# Patient Record
Sex: Male | Born: 1985 | Race: Black or African American | Hispanic: No | Marital: Single
Health system: Southern US, Community
[De-identification: ages and names within clinical notes are randomized; demographics above are authoritative.]

---

## 2018-01-13 ENCOUNTER — Emergency Department (HOSPITAL_COMMUNITY): Payer: Self-pay

## 2018-01-13 ENCOUNTER — Other Ambulatory Visit: Payer: Self-pay

## 2018-01-13 ENCOUNTER — Emergency Department (HOSPITAL_COMMUNITY)
Admission: EM | Admit: 2018-01-13 | Discharge: 2018-01-14 | Disposition: A | Discharge status: Home / Self Care | Payer: Self-pay | Attending: Emergency Medicine | Admitting: Emergency Medicine

## 2018-01-13 DIAGNOSIS — Y906 Blood alcohol level of 120-199 mg/100 ml: Secondary | ICD-10-CM | POA: Insufficient documentation

## 2018-01-13 DIAGNOSIS — F10920 Alcohol use, unspecified with intoxication, uncomplicated: Secondary | ICD-10-CM | POA: Insufficient documentation

## 2018-01-13 DIAGNOSIS — F14150 Cocaine abuse with cocaine-induced psychotic disorder with delusions: Secondary | ICD-10-CM | POA: Diagnosis present

## 2018-01-13 LAB — SALICYLATE LEVEL: Salicylate Lvl: <7 mg/dL (ref 2.8–30.0)

## 2018-01-13 LAB — CBC WITH DIFFERENTIAL/PLATELET
BASOS ABS: 0.1 10*3/uL (ref 0.0–0.1)
Basophils Relative: 1 %
Eosinophils Absolute: 0.2 10*3/uL (ref 0.0–0.7)
Eosinophils Relative: 2 %
HEMATOCRIT: 45.6 % (ref 39.0–52.0)
Hemoglobin: 16.5 g/dL (ref 13.0–17.0)
LYMPHS ABS: 4.2 10*3/uL — AB (ref 0.7–4.0)
LYMPHS PCT: 41 %
MCH: 32.5 pg (ref 26.0–34.0)
MCHC: 36.2 g/dL — AB (ref 30.0–36.0)
MCV: 89.9 fL (ref 78.0–100.0)
MONO ABS: 0.4 10*3/uL (ref 0.1–1.0)
MONOS PCT: 4 %
NEUTROS ABS: 5.4 10*3/uL (ref 1.7–7.7)
Neutrophils Relative %: 52 %
Platelets: 232 10*3/uL (ref 150–400)
RBC: 5.07 MIL/uL (ref 4.22–5.81)
RDW: 13.2 % (ref 11.5–15.5)
WBC: 10.1 10*3/uL (ref 4.0–10.5)

## 2018-01-13 LAB — COMPREHENSIVE METABOLIC PANEL
ALT: 25 U/L (ref 0–44)
AST: 28 U/L (ref 15–41)
Albumin: 4.8 g/dL (ref 3.5–5.0)
Alkaline Phosphatase: 45 U/L (ref 38–126)
Anion gap: 14 (ref 5–15)
BUN: 8 mg/dL (ref 6–20)
CO2: 20 mmol/L — ABNORMAL LOW (ref 22–32)
Calcium: 9.6 mg/dL (ref 8.9–10.3)
Chloride: 107 mmol/L (ref 98–111)
Creatinine, Ser: 0.86 mg/dL (ref 0.61–1.24)
GFR calc Af Amer: >60 mL/min (ref >60)
Glucose, Bld: 103 mg/dL — ABNORMAL HIGH (ref 70–99)
POTASSIUM: 3.6 mmol/L (ref 3.5–5.1)
Sodium: 141 mmol/L (ref 135–145)
Total Bilirubin: 0.4 mg/dL (ref 0.3–1.2)
Total Protein: 8.7 g/dL — ABNORMAL HIGH (ref 6.5–8.1)

## 2018-01-13 LAB — CK: CK TOTAL: 276 U/L (ref 49–397)

## 2018-01-13 LAB — RAPID URINE DRUG SCREEN, HOSP PERFORMED
AMPHETAMINES: NOT DETECTED
Barbiturates: NOT DETECTED
Benzodiazepines: NOT DETECTED
Cocaine: POSITIVE — AB
Opiates: NOT DETECTED
TETRAHYDROCANNABINOL: NOT DETECTED

## 2018-01-13 LAB — ETHANOL: Alcohol, Ethyl (B): 179 mg/dL — ABNORMAL HIGH (ref <10)

## 2018-01-13 LAB — ACETAMINOPHEN LEVEL: Acetaminophen (Tylenol), Serum: <10 ug/mL — ABNORMAL LOW (ref 10–30)

## 2018-01-13 MED ORDER — DIPHENHYDRAMINE HCL 50 MG/ML IJ SOLN: 50.0000 mg | Freq: Once | INTRAMUSCULAR | Status: DC | PRN

## 2018-01-13 MED ORDER — LORAZEPAM 2 MG/ML IJ SOLN: 2.0000 mg | Freq: Once | INTRAMUSCULAR | Status: DC | PRN

## 2018-01-13 MED ORDER — STERILE WATER FOR INJECTION IJ SOLN
INTRAMUSCULAR | Status: AC
  Administered 2018-01-13: 2.4 mL
  Filled 2018-01-13: qty 10

## 2018-01-13 MED ORDER — ZIPRASIDONE MESYLATE 20 MG IM SOLR: 20.0000 mg | Freq: Once | INTRAMUSCULAR | Status: DC | PRN

## 2018-01-13 MED ORDER — LORAZEPAM 2 MG/ML IJ SOLN: 2.0000 mg | Freq: Once | INTRAMUSCULAR | Status: DC

## 2018-01-13 MED ORDER — ZIPRASIDONE MESYLATE 20 MG IM SOLR
20.0000 mg | Freq: Once | INTRAMUSCULAR | Status: AC
  Administered 2018-01-13: 20 mg via INTRAMUSCULAR
  Filled 2018-01-13: qty 20

## 2018-01-13 NOTE — ED Provider Notes (Signed)
[facility]-EMERGENCY DEPT Provider Note   CSN: [id number] Arrival date & time: 01/13/18  [id number]     History   Chief Complaint Chief Complaint  Patient presents with  . acute psychosis    HPI Burnett CorrenteJamil Geralynn Rilehmad Bryant-Perry is a 32 y.o. male.  32 year old male presents via police with possible acute psychosis.  Patient seen by prior provider and was medicated due to agitation therefore history is per nursing notes as well as his note.  Patient was noted to be loud and belligerent and would not give his name.  Stated they were aliens in his blood.  Because he was agitated and combative he was given Geodon.  No further history obtainable due to his current state.     No past medical history on file.  There are no active problems to display for this patient.         Home Medications    Prior to Admission medications   Not on File    Family History No family history on file.  Social History Social History   Tobacco Use  . Smoking status: Not on file  Substance Use Topics  . Alcohol use: Not on file  . Drug use: Not on file     Allergies   Patient has no allergy information on record.   Review of Systems Review of Systems  Unable to perform ROS: Mental status change     Physical Exam Updated Vital Signs There were no vitals taken for this visit.  Physical Exam  Constitutional: He appears well-developed and well-nourished. He appears lethargic.  Non-toxic appearance. No distress.  HENT:  Head: Normocephalic and atraumatic.  Eyes: Pupils are equal, round, and reactive to light. Conjunctivae, EOM and lids are normal.  Neck: Normal range of motion. Neck supple. No tracheal deviation present. No thyroid mass present.  Cardiovascular: Normal rate, regular rhythm and normal heart sounds. Exam reveals no gallop.  No murmur heard. Pulmonary/Chest: Effort normal and breath sounds normal. No stridor. No respiratory distress. He has no decreased  breath sounds. He has no wheezes. He has no rhonchi. He has no rales.  Abdominal: Soft. Normal appearance and bowel sounds are normal. He exhibits no distension. There is no tenderness. There is no rebound and no CVA tenderness.  Musculoskeletal: Normal range of motion. He exhibits no edema or tenderness.  Neurological: He appears lethargic. No cranial nerve deficit or sensory deficit. GCS eye subscore is 2. GCS verbal subscore is 3. GCS motor subscore is 5.  Skin: Skin is warm and dry. No abrasion and no rash noted.  Psychiatric: He is inattentive.  Nursing note and vitals reviewed.    ED Treatments / Results  Labs (all labs ordered are listed, but only abnormal results are displayed) Labs Reviewed  CBC WITH DIFFERENTIAL/PLATELET - Abnormal; Notable for the following components:      Result Value   MCHC 36.2 (*)    Lymphs Abs 4.2 (*)    All other components within normal limits  COMPREHENSIVE METABOLIC PANEL  ETHANOL  ACETAMINOPHEN LEVEL  SALICYLATE LEVEL  CK  RAPID URINE DRUG SCREEN, HOSP PERFORMED    EKG EKG Interpretation  Date/Time:  Friday January 13 2018 06:49:54 EDT Ventricular Rate:  117 PR Interval:    QRS Duration: 82 QT Interval:  354 QTC Calculation: 494 R Axis:   83 Text Interpretation:  Sinus tachycardia Ventricular trigeminy LAE, consider biatrial enlargement Probable anteroseptal infarct, old Minimal ST depression, inferior leads Prolonged QT interval No  previous ECGs available Confirmed by Glynn Octave [id number]) on 01/13/2018 6:54:13 AM   Radiology No results found.  Procedures Procedures (including critical care time)  Medications Ordered in ED Medications  LORazepam (ATIVAN) injection 2 mg (has no administration in time range)  ziprasidone (GEODON) injection 20 mg (20 mg Intramuscular Given 01/13/18 0641)  sterile water (preservative free) injection (2.4 mLs  Given 01/13/18 6045)     Initial Impression / Assessment and Plan / ED Course  I have  reviewed the triage vital signs and the nursing notes.  Pertinent labs & imaging results that were available during my care of the patient were reviewed by me and considered in my medical decision making (see chart for details).     Patient was monitored here and became more alert after his alcohol and Geodon were off.  Had been placed under IVC by prior provider.  He is now medically cleared for psychiatric disposition  Final Clinical Impressions(s) / ED Diagnoses   Final diagnoses:  None    ED Discharge Orders    None       Lorre Nick, MD 01/13/18 1240

## 2018-01-13 NOTE — ED Notes (Signed)
Security and charge RN has been informed that restraints have been removed and pt is chemically restrained and compliant.

## 2018-01-13 NOTE — ED Notes (Signed)
Bed: WA15 Expected date:  Expected time:  Means of arrival:  Comments: 

## 2018-01-13 NOTE — ED Notes (Signed)
SBAR Report received from previous nurse. Pt received asleep on unit.and unable to participate in assessment of current SI/ HI, A/V H, depression, anxiety, or pain at this time, and appears otherwise stable and free of distress and resting. Pt reminded of camera surveillance, q 15 min rounds, and rules of the milieu. Will continue to assess.
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
## 2018-01-13 NOTE — ED Notes (Signed)
Patient alert and oriented, calm and cooperative.  Patient asking for a meal tray and one was provided for him.  Patient did ask, "how long do I have to stay here?"

## 2018-01-13 NOTE — ED Notes (Signed)
Patient moved from room 15 to 41 in the Acute Care Unit.  1 bag of belongings locked in locker 41.

## 2018-01-13 NOTE — ED Triage Notes (Signed)
Pt here with [employer] stating "aliens are in his blood" and "we cant take his blood" Pt yelling in triage and is not redirectable. Pt thinks it is 2033 and is unable to state his name or where he is

## 2018-01-13 NOTE — BH Assessment (Signed)
[facility] Assessment Progress Note  Case was staffed with Parks FNP who recommended patient be observed and monitored for safety. Patient will be seen by psychiatry in the a.m.     

## 2018-01-13 NOTE — ED Provider Notes (Signed)
MSE was initiated and I personally evaluated the patient and placed orders (if any) at  6:55 AM on January 13, 2018.  Patient brought in by police and appears to be acutely psychotic.  He is loud and belligerent and not redirectable.  He refuses to give his name.  He states there are "aliens in my blood".  He states the year is 2033. He will not state where he is or who he is.  He appears to be acutely psychotic and unable to make decisions for himself.  IVC paperwork is completed.  Patient chemically sedated for safety of patient and staff.  CRITICAL CARE Performed by: Glynn OctaveANCOUR, Martina Brodbeck Total critical care time: 31 minutes Critical care time was exclusive of separately billable procedures and treating other patients. Critical care was necessary to treat or prevent imminent or life-threatening deterioration. Critical care was time spent personally by me on the following activities: development of treatment plan with patient and/or surrogate as well as nursing, discussions with consultants, evaluation of patient's response to treatment, examination of patient, obtaining history from patient or surrogate, ordering and performing treatments and interventions, ordering and review of laboratory studies, ordering and review of radiographic studies, pulse oximetry and re-evaluation of patient's condition.  The patient appears stable so that the remainder of the MSE may be completed by another provider.   Glynn Octaveancour, Demetrius Barrell, MD 01/13/18 [phone]

## 2018-01-13 NOTE — ED Notes (Signed)
[facility] Expected date:  Expected time:  Means of arrival:  Comments: RM 15

## 2018-01-13 NOTE — ED Notes (Addendum)
Patient reports he is not suicidal or homicidal at this time.  When asked if he has any auditory or visual hallucinations, patient hesitated and said he did.  Patient also admitted to taking medications for these symptoms in the past but was not currently taking these meds.  Patient reports he felt like someone had slipped him some drugs.

## 2018-01-14 DIAGNOSIS — F14150 Cocaine abuse with cocaine-induced psychotic disorder with delusions: Secondary | ICD-10-CM | POA: Diagnosis present

## 2018-01-14 NOTE — Consult Note (Addendum)
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  He was given a PRN medication, Geodon, and slept.  Now, he denies suicidal/homicidal ideations, hallucinations, delusions, and withdrawal symptoms.  Encouraged him to meet with Peer Support but he refused.  Stable for discharge.  Total Time spent with patient: 45 minutes  Past Psychiatric History: substance abuse  Past Medical History: No past medical history on file.  Family History: No family history on file. Family Psychiatric  History: none Social History:  Social History   Substance and Sexual Activity  Alcohol Use Not on file    Social History   Substance and Sexual Activity  Drug Use Not on file   Social History   Socioeconomic History  . Marital status: Single    Spouse name: Not on file  . Number of children: Not on file  . Years of education: Not on file  . Highest education level: Not on file  Occupational History  . Not on file  Social Needs  . Financial resource strain: Not on file  . Food insecurity:    Worry: Not on file    Inability: Not on file  . Transportation needs:    Medical: Not on file    Non-medical: Not on file  Tobacco Use  . Smoking status: Not on file  Substance and Sexual Activity  . Alcohol use: Not on file  . Drug use: Not on file  . Sexual activity: Not on file  Lifestyle  . Physical activity:    Days per week: Not on file    Minutes per session: Not on file  . Stress: Not on file  Relationships  . Social connections:    Talks on phone:  Not on file    Gets together: Not on file    Attends religious service: Not on file    Active member of club or organization: Not on file    Attends meetings of clubs or organizations: Not on file    Relationship status: Not on file  Other Topics Concern  . Not on file  Social History Narrative  . Not on file    Has this patient used any form of tobacco in the last 30 days? (Cigarettes, Smokeless Tobacco, Cigars, and/or Pipes) A prescription for an FDA-approved tobacco cessation medication was offered at discharge and the patient refused  Current Medications: Current Facility-Administered Medications  Medication Dose Route Frequency Provider Last Rate Last Dose  . diphenhydrAMINE (BENADRYL) injection 50 mg  50 mg Intramuscular Once PRN Laveda Abbe, NP      . LORazepam (ATIVAN) injection 2 mg  2 mg Intramuscular Once Glynn Octave, MD   Stopped at 01/13/18 [phone]  . ziprasidone (GEODON) injection 20 mg  20 mg Intramuscular Once PRN Laveda Abbe, NP       Current Outpatient Medications  Medication Sig Dispense Refill  . OVER THE COUNTER MEDICATION      PTA Medications:  (Not in a hospital admission)  Musculoskeletal: Strength & Muscle Tone: within normal limits Gait & Station: normal Patient leans: N/A  Psychiatric Specialty Exam: Physical Exam  Nursing note and vitals reviewed. Constitutional: He is oriented to person, place, and time. He appears well-developed and well-nourished.  HENT:  Head: Normocephalic.  Neck: Normal range of motion.  Respiratory: Effort normal.  Musculoskeletal: Normal range of motion.  Neurological: He is alert and oriented to person, place, and time.  Psychiatric: His speech is normal and behavior is normal. Judgment and thought content normal. His mood appears anxious. His affect is blunt. Cognition and memory are normal.    Review of Systems  Psychiatric/Behavioral: Positive for substance abuse. The patient is  nervous/anxious.   All other systems reviewed and are negative.   Blood pressure (!) 125/94, pulse 63, temperature 98 F (36.7 C), temperature source Oral, resp. rate 18, SpO2 100 %.There is no height or weight on file to calculate BMI.  General Appearance: Casual  Eye Contact:  Good  Speech:  Normal Rate  Volume:  Normal  Mood:  Anxious  Affect:  Blunt  Thought Process:  Coherent and Descriptions of Associations: Intact  Orientation:  Full (Time, Place, and Person)  Thought Content:  WDL and Logical  Suicidal Thoughts:  No  Homicidal Thoughts:  No  Memory:  Immediate;   Good Recent;   Good Remote;   Good  Judgement:  Fair  Insight:  Fair  Psychomotor Activity:  Normal  Concentration:  Concentration: Good and Attention Span: Good  Recall:  Good  Fund of Knowledge:  Fair  Language:  Good  Akathisia:  No  Handed:  Right  AIMS (if indicated):     Assets:  Leisure Time Physical Health Resilience Social Support  ADL's:  Intact  Cognition:  WNL  Sleep:        Demographic Factors:  Male  Loss Factors: NA  Historical Factors: NA  Risk Reduction Factors:   Sense of responsibility to family, Living with another person, especially a relative and Positive social support  Continued Clinical Symptoms:  Anxiety, mild  Cognitive Features That Contribute To Risk:  None    Suicide Risk:  Minimal: No identifiable suicidal ideation.  Patients presenting with no risk factors but with morbid ruminations; may be classified as minimal risk based on the severity of the depressive symptoms    Plan Of Care/Follow-up recommendations:  Activity:  as tolerated Diet:  heart healthy diet  Disposition: discharge home Nanine MeansLORD, JAMISON, NP 01/14/2018, 9:36 AM   Agree with NP assessment

## 2018-01-14 NOTE — ED Notes (Signed)
Pt has called for his ride, which he says will be here at approx 1500. He continues to be calm/cooperative.

## 2018-01-14 NOTE — ED Notes (Signed)
Report received from previous shift. Randall Wade has remained asleep since shift began, leaving breakfast tray untouched. He remains safe with checks, rounds, and camera monitoring in place.

## 2019-09-11 IMAGING — CT CT HEAD W/O CM
1 series · 16 of 30 positions shown, 20 images · non-contrast
Comparison: None.

CLINICAL DATA: 31-year-old male with a history of psychosis

EXAM:
CT HEAD WITHOUT CONTRAST
TECHNIQUE: Contiguous axial images were obtained from the base of the skull
through the vertex without intravenous contrast.

[Series 2: head wo · axial · 0.47mm/px · z∈[-184,-49]mm · 16 of 30 slices shown, 20 images]
[im 2/30  brain]
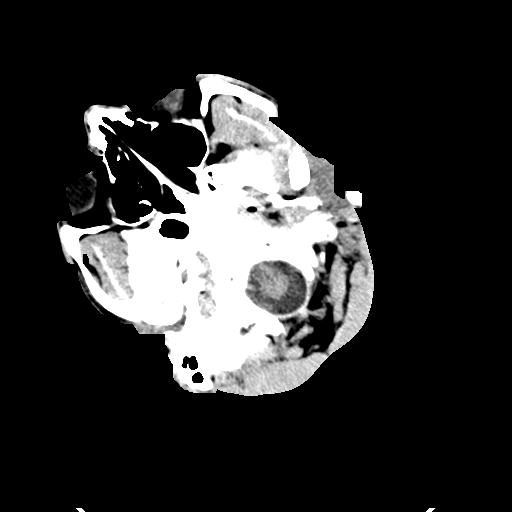
[im 2/30  bone]
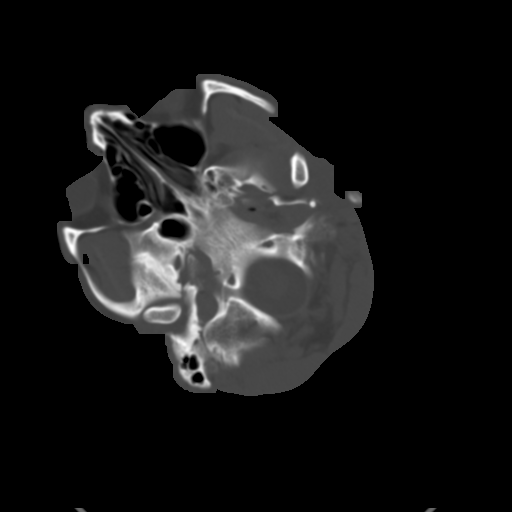
[im 4/30  brain]
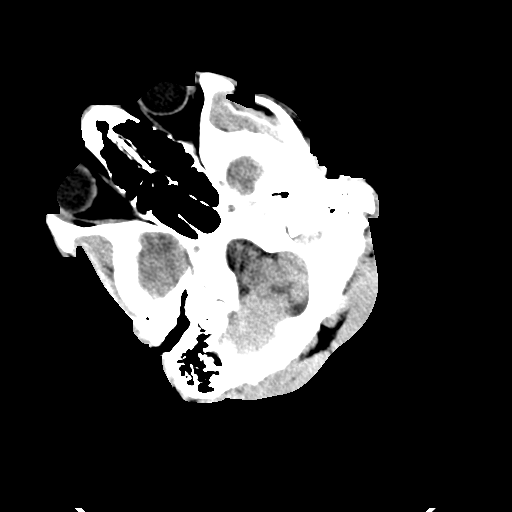
[im 6/30  brain]
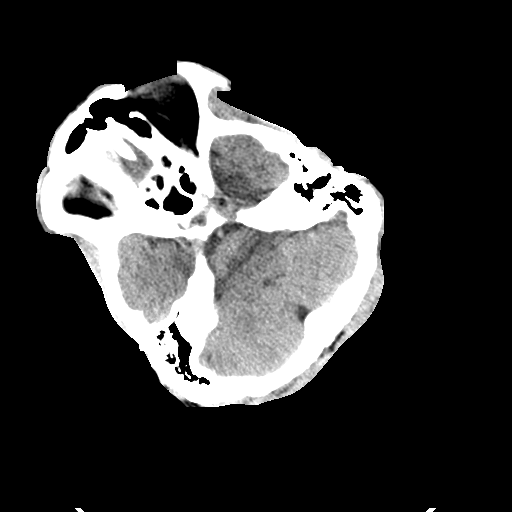
[im 8/30  brain]
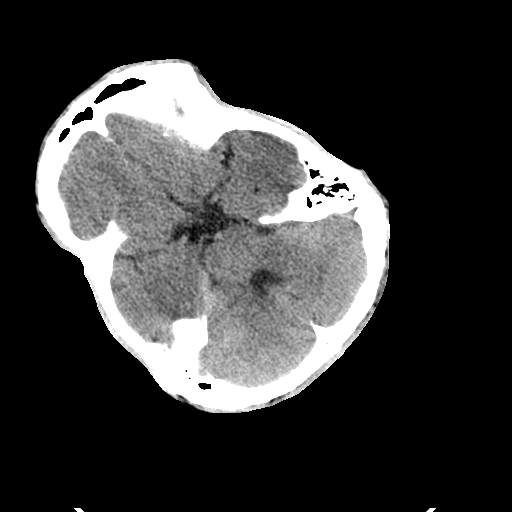
[im 9/30  brain]
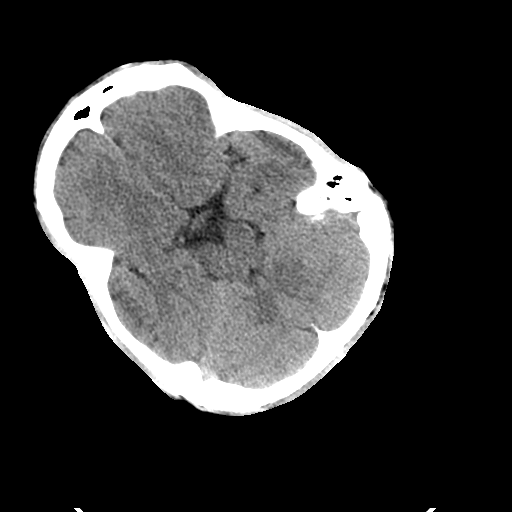
[im 9/30  bone]
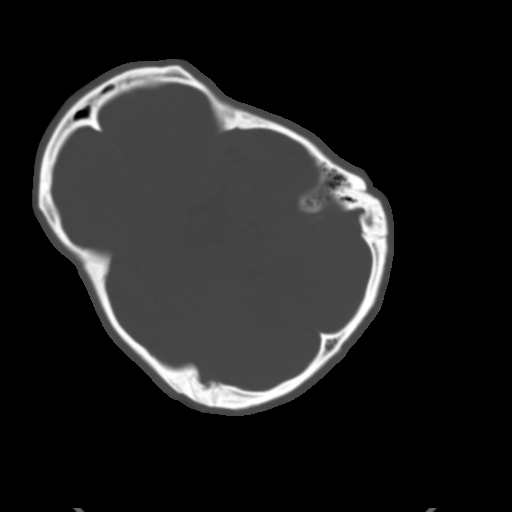
[im 11/30  brain]
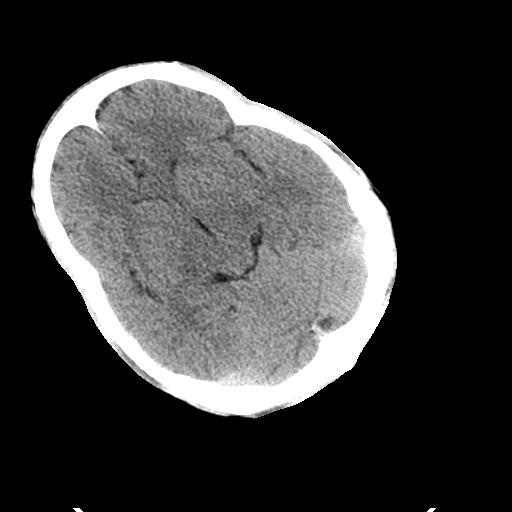
[im 13/30  brain]
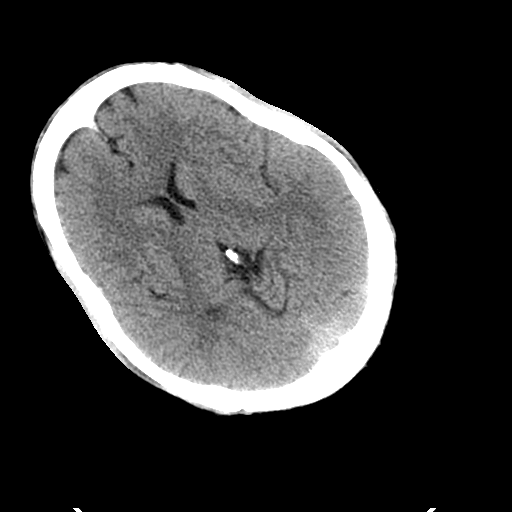
[im 15/30  brain]
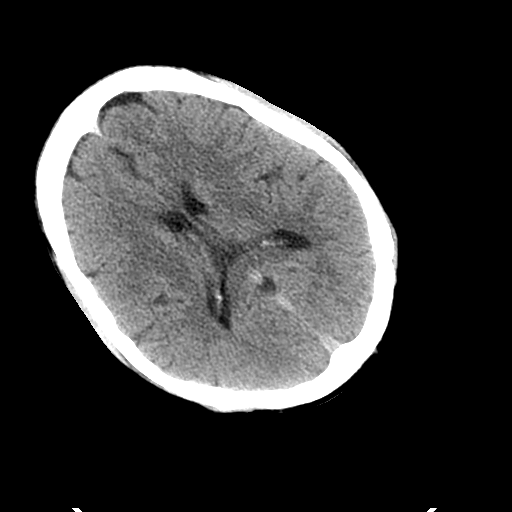
[im 16/30  brain]
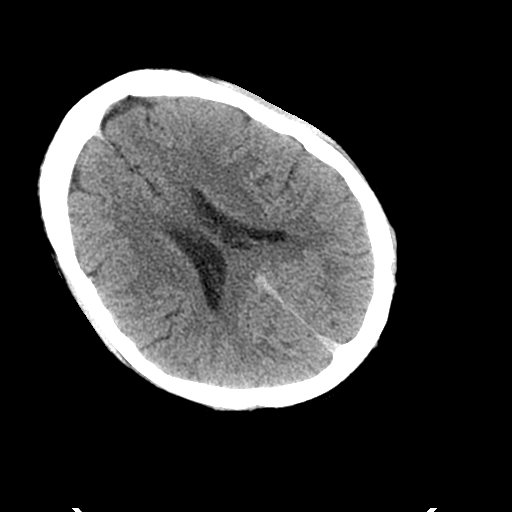
[im 16/30  bone]
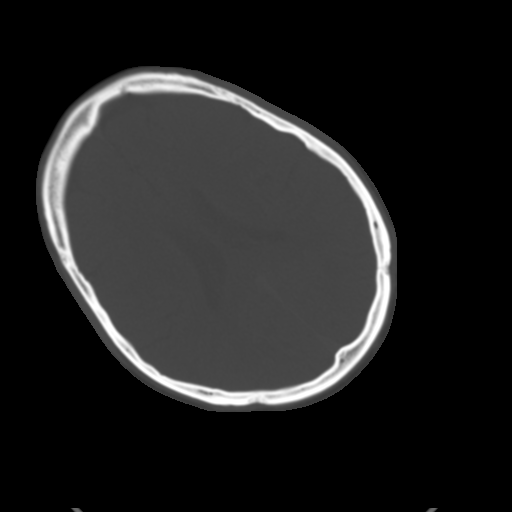
[im 18/30  brain]
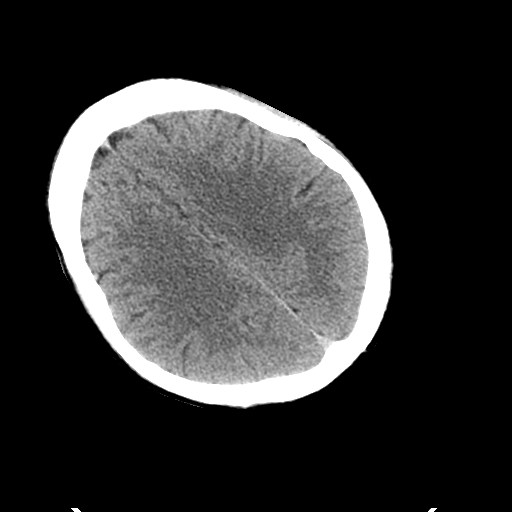
[im 20/30  brain]
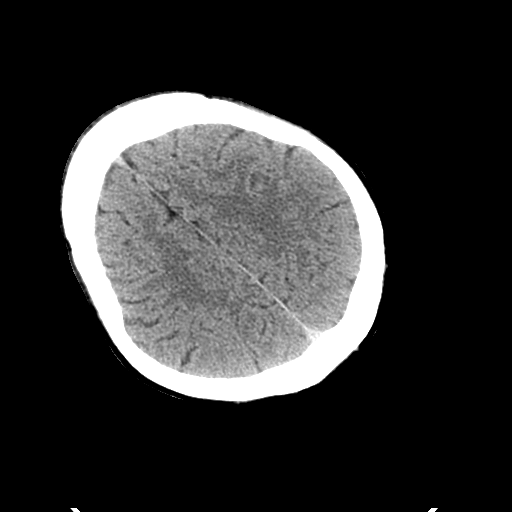
[im 22/30  brain]
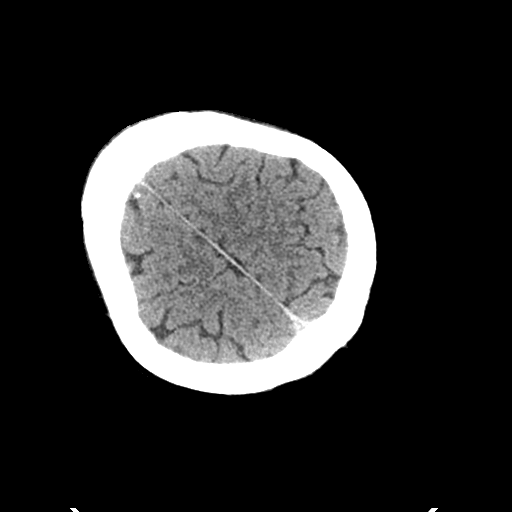
[im 23/30  brain]
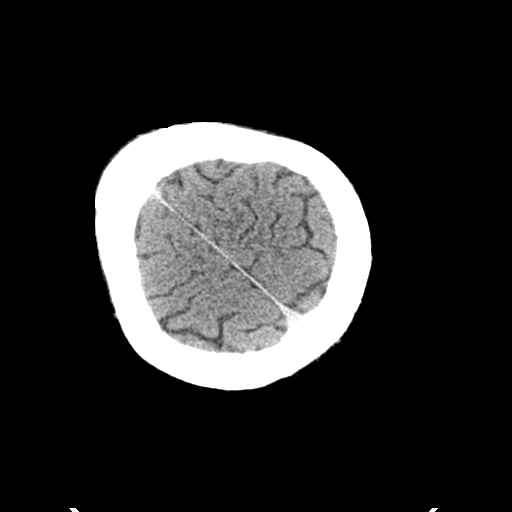
[im 23/30  bone]
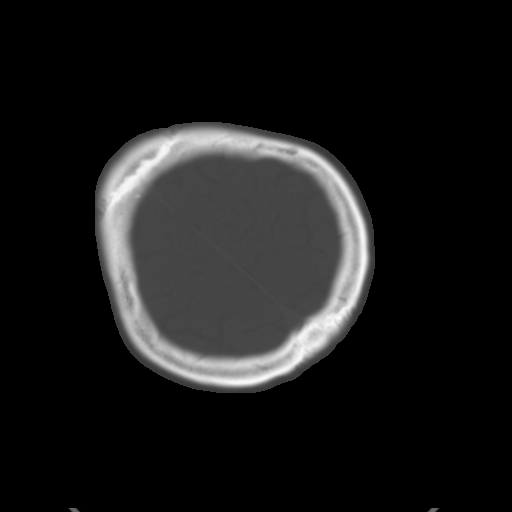
[im 25/30  brain]
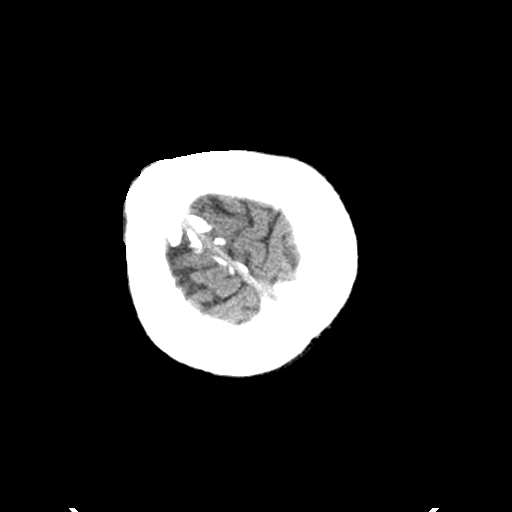
[im 27/30  brain]
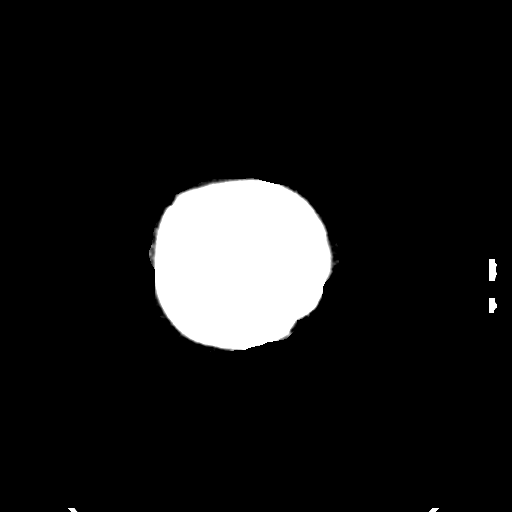
[im 29/30  brain]
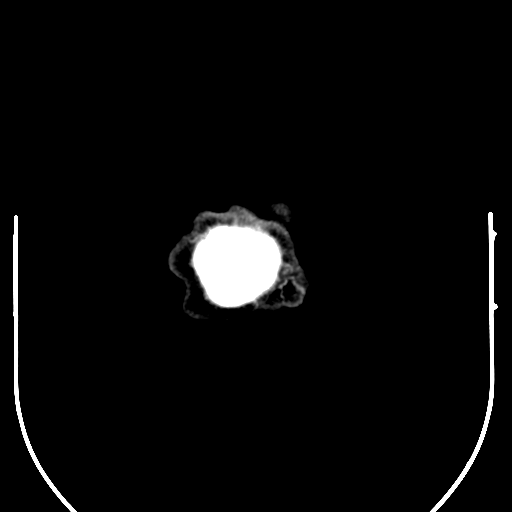

[16 of 30 positions shown; findings below may reference images not displayed]

FINDINGS: Brain: No acute intracranial hemorrhage. No midline shift or mass
effect. Gray-white differentiation maintained. Unremarkable
appearance of the ventricular system.

Vascular: Unremarkable.

Skull: No acute fracture.  No aggressive bone lesion identified.

Sinuses/Orbits: Unremarkable appearance of the orbits. Mastoid air
cells clear. No middle ear effusion. No significant sinus disease.

Other: None
IMPRESSION: Negative head CT
